# Patient Record
Sex: Male | Born: 1995 | Race: White | Hispanic: No | Marital: Single | Smoking: Never smoker
Health system: Southern US, Community
[De-identification: ages and names within clinical notes are randomized; demographics above are authoritative.]

---
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
## 2019-07-26 ENCOUNTER — Encounter: Payer: Self-pay | Admitting: Emergency Medicine

## 2019-07-26 ENCOUNTER — Other Ambulatory Visit: Payer: Self-pay

## 2019-07-26 ENCOUNTER — Emergency Department (INDEPENDENT_AMBULATORY_CARE_PROVIDER_SITE_OTHER)
Admission: EM | Admit: 2019-07-26 | Discharge: 2019-07-26 | Disposition: A | Discharge status: Home / Self Care | Payer: HRSA Program | Source: Home / Self Care

## 2019-07-26 DIAGNOSIS — J069 Acute upper respiratory infection, unspecified: Secondary | ICD-10-CM | POA: Diagnosis not present

## 2019-07-26 DIAGNOSIS — Z20828 Contact with and (suspected) exposure to other viral communicable diseases: Secondary | ICD-10-CM

## 2019-07-26 DIAGNOSIS — R0981 Nasal congestion: Secondary | ICD-10-CM

## 2019-07-26 NOTE — ED Provider Notes (Signed)
[name]    CSN: [id number] Arrival date & time: 07/26/19  3810      History   Chief Complaint Chief Complaint  Patient presents with  . URI    COVID testing    HPI Jamie Hubbard is a 23 y.o. male.   The history is provided by the patient. No language interpreter was used.  URI Presenting symptoms: congestion   Severity:  Mild Duration:  5 days Chronicity:  New Relieved by:  Nothing Worsened by:  Nothing Risk factors: sick contacts    Pt has been around people who have been exposed to covie History reviewed. No pertinent past medical history.  Patient Active Problem List   Diagnosis Date Noted  . WRIST PAIN, RIGHT 12/16/2008  . CONTUSION, LEFT KNEE 10/01/2008    History reviewed. No pertinent surgical history.     Home Medications    Prior to Admission medications   Not on File    Family History History reviewed. No pertinent family history.  Social History Social History   Tobacco Use  . Smoking status: Not on file  Substance Use Topics  . Alcohol use: Not on file  . Drug use: Not on file     Allergies   Augmentin [amoxicillin-pot clavulanate]   Review of Systems Review of Systems  HENT: Positive for congestion.   All other systems reviewed and are negative.    Physical Exam Triage Vital Signs ED Triage Vitals  Enc Vitals Group     BP 07/26/19 0851 125/82     Pulse Rate 07/26/19 0851 88     Resp --      Temp 07/26/19 0851 97.9 F (36.6 C)     Temp Source 07/26/19 0851 Oral     SpO2 07/26/19 0851 97 %     Weight 07/26/19 0852 284 lb 6.4 oz (129 kg)     Height 07/26/19 0852 6' (1.829 m)     Head Circumference --      Peak Flow --      Pain Score 07/26/19 0851 0     Pain Loc --      Pain Edu? --      Excl. in [place]? --    No data found.  Updated Vital Signs BP 125/82 (BP Location: Right Arm)   Pulse 88   Temp 97.9 F (36.6 C) (Oral)   Ht 6' (1.829 m)   Wt 129 kg   SpO2 97%   BMI 38.57 kg/m   Visual  Acuity Right Eye Distance:   Left Eye Distance:   Bilateral Distance:    Right Eye Near:   Left Eye Near:    Bilateral Near:     Physical Exam Vitals and nursing note reviewed.  Constitutional:      Appearance: He is well-developed.  HENT:     Head: Normocephalic and atraumatic.     Nose: Nose normal.  Eyes:     Conjunctiva/sclera: Conjunctivae normal.  Cardiovascular:     Rate and Rhythm: Normal rate and regular rhythm.     Heart sounds: No murmur.  Pulmonary:     Effort: Pulmonary effort is normal. No respiratory distress.     Breath sounds: Normal breath sounds.  Abdominal:     Palpations: Abdomen is soft.     Tenderness: There is no abdominal tenderness.  Musculoskeletal:     Cervical back: Neck supple.  Skin:    General: Skin is warm and dry.  Neurological:  General: No focal deficit present.     Mental Status: He is alert.  Psychiatric:        Mood and Affect: Mood normal.      UC Treatments / Results  Labs (all labs ordered are listed, but only abnormal results are displayed) Labs Reviewed - No data to display  EKG   Radiology No results found.  Procedures Procedures (including critical care time)  Medications Ordered in UC Medications - No data to display  Initial Impression / Assessment and Plan / UC Course  I have reviewed the triage vital signs and the nursing notes.  Pertinent labs & imaging results that were available during my care of the patient were reviewed by me and considered in my medical decision making (see chart for details).     [facility] send out covid ordered  Final Clinical Impressions(s) / UC Diagnoses   Final diagnoses:  Upper respiratory tract infection, unspecified type     Discharge Instructions     Return if any problems.   ED Prescriptions    None     PDMP not reviewed this encounter.  An After Visit Summary was printed and given to the patient.    Elson Areas, [place] 07/26/19 [phone]

## 2019-07-26 NOTE — ED Triage Notes (Signed)
Here for covid testing. Reports several church members of mother tested pos last Sunday.  Mom is asymptomatic; not tested as of yet. Starting having dry cough, runny nose, and sore throat x 4 dys.

## 2019-07-26 NOTE — Discharge Instructions (Signed)
Return if any problems.

## 2019-07-29 LAB — SARS-COV-2 RNA,(COVID-19) QUALITATIVE NAAT: SARS CoV2 RNA: NOT DETECTED

## 2022-03-05 ENCOUNTER — Emergency Department
Admission: RE | Admit: 2022-03-05 | Discharge: 2022-03-05 | Disposition: A | Discharge status: Home / Self Care | Payer: BC Managed Care – PPO | Source: Ambulatory Visit | Attending: Family Medicine | Admitting: Family Medicine

## 2022-03-05 VITALS — BP 149/86 | HR 93 | Temp 99.5°F | Resp 18 | Ht 71.0 in | Wt 280.0 lb

## 2022-03-05 DIAGNOSIS — U071 COVID-19: Secondary | ICD-10-CM | POA: Diagnosis not present

## 2022-03-05 DIAGNOSIS — R030 Elevated blood-pressure reading, without diagnosis of hypertension: Secondary | ICD-10-CM | POA: Diagnosis not present

## 2022-03-05 DIAGNOSIS — J029 Acute pharyngitis, unspecified: Secondary | ICD-10-CM | POA: Diagnosis not present

## 2022-03-05 LAB — POCT RAPID STREP A (OFFICE): Rapid Strep A Screen: NEGATIVE

## 2022-03-05 LAB — POC SARS CORONAVIRUS 2 AG -  ED: SARS Coronavirus 2 Ag: POSITIVE — AB

## 2022-03-05 MED ORDER — PAXLOVID (300/100) 20 X 150 MG & 10 X 100MG PO TBPK: ORAL_TABLET | ORAL | 0 refills | Status: DC

## 2022-03-05 NOTE — ED Triage Notes (Signed)
Patient c/o sore throat x 3 days, fever x 3 days that broke this morning.  Patient does have a history of strep throat.  Patient has taken Tylenol for the pain.

## 2022-03-05 NOTE — ED Provider Notes (Signed)
[facility]    CSN: [id number] Arrival date & time: 03/05/22  1017      History   Chief Complaint Chief Complaint  Patient presents with   Sore Throat    Entered by patient    HPI Jamie Hubbard is a 26 y.o. male.   HPI  Pleasant 25 year old.  States he is "prone to strep throat".  Is here with a sore throat for 3 days.  Has had fever and body aches as well.  Some runny nose and cough but not prominent.  No known exposure to illness.  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  No one else at home is sick He has not had COVID vaccinations. He did have COVID over a year ago. He vapes nicotine products  History reviewed. No pertinent past medical history.  Patient Active Problem List   Diagnosis Date Noted   WRIST PAIN, RIGHT 12/16/2008   CONTUSION, LEFT KNEE 10/01/2008    History reviewed. No pertinent surgical history.     Home Medications    Prior to Admission medications   Medication Sig Start Date End Date Taking? Authorizing Provider  nirmatrelvir & ritonavir (PAXLOVID, 300/100,) 20 x 150 MG & 10 x 100MG  TBPK Take as directed 03/05/22  Yes 03/07/22, MD    Family History Family History  Problem Relation Age of Onset   Healthy Mother    Healthy Father     Social History Social History   Tobacco Use   Smoking status: Never   Smokeless tobacco: Current  Vaping Use   Vaping Use: Every day  Substance Use Topics   Alcohol use: Never   Drug use: Never     Allergies   Augmentin [amoxicillin-pot clavulanate]   Review of Systems Review of Systems See HPI  Physical Exam Triage Vital Signs ED Triage Vitals  Enc Vitals Group     BP 03/05/22 1032 (!) 149/86     Pulse Rate 03/05/22 1032 93     Resp 03/05/22 1032 18     Temp 03/05/22 1032 99.5 F (37.5 C)     Temp Source 03/05/22 1032 Oral     SpO2 03/05/22 1032 97 %     Weight 03/05/22 1034 280 lb (127 kg)     Height 03/05/22 1034 5\' 11"  (1.803 m)      Head Circumference --      Peak Flow --      Pain Score 03/05/22 1034 7     Pain Loc --      Pain Edu? --      Excl. in [place]? --    No data found.  Updated Vital Signs BP (!) 149/86 (BP Location: Right Arm)   Pulse 93   Temp 99.5 F (37.5 C) (Oral)   Resp 18   Ht 5\' 11"  (1.803 m)   Wt 127 kg   SpO2 97%   BMI 39.05 kg/m       Physical Exam Constitutional:      General: He is not in acute distress.    Appearance: He is well-developed. He is obese. He is ill-appearing.  HENT:     Head: Normocephalic and atraumatic.     Right Ear: Tympanic membrane and ear canal normal.     Left Ear: Tympanic membrane and ear canal normal.     Nose: Congestion and rhinorrhea present.     Comments: Clear rhinorrhea    Mouth/Throat:  Mouth: Mucous membranes are moist.     Pharynx: Posterior oropharyngeal erythema present.  Eyes:     Conjunctiva/sclera: Conjunctivae normal.     Pupils: Pupils are equal, round, and reactive to light.  Cardiovascular:     Rate and Rhythm: Normal rate and regular rhythm.     Heart sounds: Normal heart sounds.  Pulmonary:     Effort: Pulmonary effort is normal. No respiratory distress.     Breath sounds: Normal breath sounds.  Abdominal:     General: There is no distension.     Palpations: Abdomen is soft.  Musculoskeletal:        General: Normal range of motion.     Cervical back: Normal range of motion.  Lymphadenopathy:     Cervical: Cervical adenopathy present.  Skin:    General: Skin is warm and dry.  Neurological:     Mental Status: He is alert.  Psychiatric:        Mood and Affect: Mood normal.        Behavior: Behavior normal.      UC Treatments / Results  Labs (all labs ordered are listed, but only abnormal results are displayed) Labs Reviewed  POC SARS CORONAVIRUS 2 AG -  ED - Abnormal; Notable for the following components:      Result Value   SARS Coronavirus 2 Ag Positive (*)    All other components within normal limits  POCT  RAPID STREP A (OFFICE)    EKG   Radiology No results found.  Procedures Procedures (including critical care time)  Medications Ordered in UC Medications - No data to display  Initial Impression / Assessment and Plan / UC Course  I have reviewed the triage vital signs and the nursing notes.  Pertinent labs & imaging results that were available during my care of the patient were reviewed by me and considered in my medical decision making (see chart for details).     Strep test done initially.  It is negative.  COVID test is then performed.  And was positive.  Quarantine and home care discussed.  Prescription for Paxlovid given. Final Clinical Impressions(s) / UC Diagnoses   Final diagnoses:  Acute pharyngitis, unspecified etiology  Elevated blood pressure reading  COVID-19     Discharge Instructions      Take Paxlovid 2 times a day for 5 days Take 2 doses today Drink lots of fluids Tylenol or ibuprofen for pain and fever Must quarantine for 5 days and then wear a mask for 5 additional days, until 10 days after illness started Call for problems Follow-up with your primary care doctor regarding your elevated blood pressure     ED Prescriptions     Medication Sig Dispense Auth. Provider   nirmatrelvir & ritonavir (PAXLOVID, 300/100,) 20 x 150 MG & 10 x 100MG  TBPK Take as directed 1 each Delton See, MD      PDMP not reviewed this encounter.   Letta Pate, MD 03/05/22 [phone]

## 2022-03-05 NOTE — Discharge Instructions (Addendum)
Take Paxlovid 2 times a day for 5 days Take 2 doses today Drink lots of fluids Tylenol or ibuprofen for pain and fever Must quarantine for 5 days and then wear a mask for 5 additional days, until 10 days after illness started Call for problems Follow-up with your primary care doctor regarding your elevated blood pressure

## 2022-03-06 ENCOUNTER — Telehealth: Payer: Self-pay

## 2022-03-06 NOTE — Telephone Encounter (Signed)
TCT pt to f/u on recent visit. HIPAA compliant VM left for return call.

## 2023-05-02 ENCOUNTER — Ambulatory Visit
Admission: RE | Admit: 2023-05-02 | Discharge: 2023-05-02 | Disposition: A | Discharge status: Home / Self Care | Payer: BC Managed Care – PPO | Source: Ambulatory Visit | Attending: Internal Medicine | Admitting: Internal Medicine

## 2023-05-02 VITALS — BP 155/99 | HR 74 | Temp 99.0°F | Resp 18 | Ht 72.0 in | Wt 280.0 lb

## 2023-05-02 DIAGNOSIS — R519 Headache, unspecified: Secondary | ICD-10-CM

## 2023-05-02 DIAGNOSIS — U071 COVID-19: Secondary | ICD-10-CM | POA: Diagnosis not present

## 2023-05-02 DIAGNOSIS — J069 Acute upper respiratory infection, unspecified: Secondary | ICD-10-CM

## 2023-05-02 LAB — POC SARS CORONAVIRUS 2 AG -  ED: SARS Coronavirus 2 Ag: POSITIVE — AB

## 2023-05-02 MED ORDER — BENZONATATE 100 MG PO CAPS: 100.0000 mg | ORAL_CAPSULE | Freq: Three times a day (TID) | ORAL | 0 refills | Status: AC

## 2023-05-02 MED ORDER — GUAIFENESIN ER 1200 MG PO TB12: 1200.0000 mg | ORAL_TABLET | Freq: Two times a day (BID) | ORAL | 0 refills | Status: AC

## 2023-05-02 MED ORDER — PAXLOVID (300/100) 20 X 150 MG & 10 X 100MG PO TBPK: ORAL_TABLET | ORAL | 0 refills | Status: AC

## 2023-05-02 NOTE — Discharge Instructions (Addendum)
You have COVID-19 viral illness. Wear a mask for 5 days of symptoms. You may return to public within those 5 days as long as you do not have a fever. Wash hands frequently.  Take paxlovid as prescribed. Start medicine tonight. 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  - Take prescribed medicines to help with symptoms: tessalon perles, promethazine DM (drowsiness precautions with cough syrup, only take at bedtime) - Use over the counter medicines to help with symptoms as discussed: Tylenol, guaifenesin (mucinex), zyrtec, etc - Two teaspoons of honey in warm water every 4-6 hours may help with throat pains - Humidifier in your room at night to help add water the air and soothe cough  If you develop any new or worsening symptoms or do not improve in the next 2 to 3 days, please return.  If your symptoms are severe, please go to the emergency room.  Follow-up with PCP as needed.

## 2023-05-02 NOTE — ED Triage Notes (Signed)
Patient c/o sinus pressure, congestion, nasal drainage x 3 days.  Denies any cough, low grade fever.  Monday night fever of 99.5, Tuesday morning 99.5.  Patient has taken Advil Cold & Sinus and Claritin.

## 2023-05-02 NOTE — ED Provider Notes (Signed)
[facility]    CSN: [id number] Arrival date & time: 05/02/23  0849      History   Chief Complaint Chief Complaint  Patient presents with   Nasal Congestion    Entered by patient    HPI Jamie Hubbard is a 27 y.o. male.   Jamie Hubbard is a 27 y.o. male presenting for chief complaint of nasal congestion, generalized frontal headache, sore throat, generalized fatigue that started 3 days ago on Sunday, April 29, 2023.  Max temp documented at home 100.0.  Low-grade fever responded well to ibuprofen.  No cough, dizziness, chest pain, shortness of breath, vision changes, N/B/D, abdominal pain, rash.  His parents are sick with similar symptoms.  Denies recent travel.  Never smoker, denies drug use.  Denies history of chronic respiratory problems.  Taking Advil Cold and Sinus and Claritin with some relief.     History reviewed. No pertinent past medical history.  Patient Active Problem List   Diagnosis Date Noted   WRIST PAIN, RIGHT 12/16/2008   CONTUSION, LEFT KNEE 10/01/2008    History reviewed. No pertinent surgical history.     Home Medications    Prior to Admission medications   Medication Sig Start Date End Date Taking? Authorizing Provider  benzonatate (TESSALON) 100 MG capsule Take 1 capsule (100 mg total) by mouth every 8 (eight) hours. 05/02/23  Yes Carlisle Beers, FNP  Guaifenesin 1200 MG TB12 Take 1 tablet (1,200 mg total) by mouth in the morning and at bedtime. 05/02/23  Yes Carlisle Beers, FNP  nirmatrelvir & ritonavir (PAXLOVID, 300/100,) 20 x 150 MG & 10 x 100MG  TBPK Take as directed 05/02/23   Carlisle Beers, FNP    Family History Family History  Problem Relation Age of Onset   Healthy Mother    Healthy Father     Social History Social History   Tobacco Use   Smoking status: Never   Smokeless tobacco: Current  Vaping Use   Vaping status: Every Day  Substance Use Topics   Alcohol use: Never   Drug use: Never      Allergies   Augmentin [amoxicillin-pot clavulanate]   Review of Systems Review of Systems Per HPI  Physical Exam Triage Vital Signs ED Triage Vitals  Encounter Vitals Group     BP 05/02/23 0902 (!) 155/99     Systolic BP Percentile --      Diastolic BP Percentile --      Pulse Rate 05/02/23 0902 74     Resp 05/02/23 0902 18     Temp 05/02/23 0902 99 F (37.2 C)     Temp Source 05/02/23 0902 Oral     SpO2 05/02/23 0902 96 %     Weight 05/02/23 0903 280 lb (127 kg)     Height 05/02/23 0903 6' (1.829 m)     Head Circumference --      Peak Flow --      Pain Score 05/02/23 0903 1     Pain Loc --      Pain Education --      Exclude from Growth Chart --    No data found.  Updated Vital Signs BP (!) 155/99 (BP Location: Right Arm)   Pulse 74   Temp 99 F (37.2 C) (Oral)   Resp 18   Ht 6' (1.829 m)   Wt 280 lb (127 kg)   SpO2 96%   BMI 37.97 kg/m   Visual Acuity Right Eye Distance:  Left Eye Distance:   Bilateral Distance:    Right Eye Near:   Left Eye Near:    Bilateral Near:     Physical Exam Vitals and nursing note reviewed.  Constitutional:      Appearance: He is not ill-appearing or toxic-appearing.  HENT:     Head: Normocephalic and atraumatic.     Right Ear: Hearing, tympanic membrane, ear canal and external ear normal.     Left Ear: Hearing, tympanic membrane, ear canal and external ear normal.     Nose: Congestion present.     Mouth/Throat:     Lips: Pink.     Mouth: Mucous membranes are moist. No injury.     Tongue: No lesions. Tongue does not deviate from midline.     Palate: No mass and lesions.     Pharynx: Oropharynx is clear. Uvula midline. Posterior oropharyngeal erythema present. No pharyngeal swelling, oropharyngeal exudate or uvula swelling.     Tonsils: No tonsillar exudate or tonsillar abscesses.  Eyes:     General: Lids are normal. Vision grossly intact. Gaze aligned appropriately.     Extraocular Movements: Extraocular  movements intact.     Conjunctiva/sclera: Conjunctivae normal.  Cardiovascular:     Rate and Rhythm: Normal rate and regular rhythm.     Heart sounds: Normal heart sounds, S1 normal and S2 normal.  Pulmonary:     Effort: Pulmonary effort is normal. No respiratory distress.     Breath sounds: Normal breath sounds and air entry. No wheezing, rhonchi or rales.  Chest:     Chest wall: No tenderness.  Musculoskeletal:     Cervical back: Neck supple.  Lymphadenopathy:     Cervical: No cervical adenopathy.  Skin:    General: Skin is warm and dry.     Capillary Refill: Capillary refill takes less than 2 seconds.     Findings: No rash.  Neurological:     General: No focal deficit present.     Mental Status: He is alert and oriented to person, place, and time. Mental status is at baseline.     Cranial Nerves: No dysarthria or facial asymmetry.  Psychiatric:        Mood and Affect: Mood normal.        Speech: Speech normal.        Behavior: Behavior normal.        Thought Content: Thought content normal.        Judgment: Judgment normal.      UC Treatments / Results  Labs (all labs ordered are listed, but only abnormal results are displayed) Labs Reviewed  POC SARS CORONAVIRUS 2 AG -  ED - Abnormal; Notable for the following components:      Result Value   SARS Coronavirus 2 Ag Positive (*)    All other components within normal limits    EKG   Radiology No results found.  Procedures Procedures (including critical care time)  Medications Ordered in UC Medications - No data to display  Initial Impression / Assessment and Plan / UC Course  I have reviewed the triage vital signs and the nursing notes.  Pertinent labs & imaging results that were available during my care of the patient were reviewed by me and considered in my medical decision making (see chart for details).   1. COVID-19 POC COVID-19 test positive. Symptomatic treatment with rest and adequate fluid intake.  May use OTC medications as needed for symptomatic relief. Prescriptions for further symptomatic relief sent:  guaifenesin, tessalon perles  Lungs clear, therefore deferred imaging.  Patient is is a candidate for antiviral, Paxlovid sent. Most recent GFR from 05/2021 CMP >60 (care everywhere). Discussed most up to date CDC guidelines regarding quarantine and masking to prevent transmission.  Work/school excuse note given.  Counseled patient on potential for adverse effects with medications prescribed/recommended today, strict ER and return-to-clinic precautions discussed, patient verbalized understanding.    Final Clinical Impressions(s) / UC Diagnoses   Final diagnoses:  COVID-19  Bad headache     Discharge Instructions      You have COVID-19 viral illness. Wear a mask for 5 days of symptoms. You may return to public within those 5 days as long as you do not have a fever. Wash hands frequently.  Take paxlovid as prescribed. Start medicine tonight. 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  - Take prescribed medicines to help with symptoms: tessalon perles, promethazine DM (drowsiness precautions with cough syrup, only take at bedtime) - Use over the counter medicines to help with symptoms as discussed: Tylenol, guaifenesin (mucinex), zyrtec, etc - Two teaspoons of honey in warm water every 4-6 hours may help with throat pains - Humidifier in your room at night to help add water the air and soothe cough  If you develop any new or worsening symptoms or do not improve in the next 2 to 3 days, please return.  If your symptoms are severe, please go to the emergency room.  Follow-up with PCP as needed.     ED Prescriptions     Medication Sig Dispense Auth. Provider   nirmatrelvir & ritonavir (PAXLOVID, 300/100,) 20 x 150 MG & 10 x 100MG  TBPK Take as directed 1 each Carlisle Beers, FNP   Guaifenesin 1200 MG TB12 Take 1 tablet (1,200 mg total) by  mouth in the morning and at bedtime. 14 tablet Reita May M, FNP   benzonatate (TESSALON) 100 MG capsule Take 1 capsule (100 mg total) by mouth every 8 (eight) hours. 21 capsule Carlisle Beers, FNP      PDMP not reviewed this encounter.   Carlisle Beers, [place] 05/02/23 [phone]
